# Patient Record
Sex: Male | Born: 1990 | Race: White | Hispanic: No | Marital: Single | Smoking: Former smoker
Health system: Southern US, Community
[De-identification: ages and names within clinical notes are randomized; demographics above are authoritative.]

## PROBLEM LIST (undated history)

## (undated) DIAGNOSIS — I3139 Other pericardial effusion (noninflammatory): Secondary | ICD-10-CM

## (undated) DIAGNOSIS — I313 Pericardial effusion (noninflammatory): Secondary | ICD-10-CM

## (undated) HISTORY — PX: OTHER SURGICAL HISTORY: SHX169

## (undated) HISTORY — PX: PERICARDIAL FLUID DRAINAGE: SHX5100

---

## 2015-07-17 ENCOUNTER — Emergency Department (HOSPITAL_COMMUNITY)
Admission: EM | Admit: 2015-07-17 | Discharge: 2015-07-17 | Disposition: A | Discharge status: Home / Self Care | Payer: Medicaid Other | Attending: Emergency Medicine | Admitting: Emergency Medicine

## 2015-07-17 ENCOUNTER — Encounter (HOSPITAL_COMMUNITY): Payer: Self-pay

## 2015-07-17 DIAGNOSIS — Z79899 Other long term (current) drug therapy: Secondary | ICD-10-CM | POA: Insufficient documentation

## 2015-07-17 DIAGNOSIS — R112 Nausea with vomiting, unspecified: Secondary | ICD-10-CM

## 2015-07-17 DIAGNOSIS — K2970 Gastritis, unspecified, without bleeding: Secondary | ICD-10-CM

## 2015-07-17 DIAGNOSIS — Z880 Allergy status to penicillin: Secondary | ICD-10-CM | POA: Insufficient documentation

## 2015-07-17 DIAGNOSIS — F10120 Alcohol abuse with intoxication, uncomplicated: Secondary | ICD-10-CM | POA: Diagnosis not present

## 2015-07-17 DIAGNOSIS — Z87891 Personal history of nicotine dependence: Secondary | ICD-10-CM | POA: Diagnosis not present

## 2015-07-17 DIAGNOSIS — R079 Chest pain, unspecified: Secondary | ICD-10-CM | POA: Diagnosis present

## 2015-07-17 DIAGNOSIS — F10920 Alcohol use, unspecified with intoxication, uncomplicated: Secondary | ICD-10-CM

## 2015-07-17 DIAGNOSIS — R0789 Other chest pain: Secondary | ICD-10-CM

## 2015-07-17 LAB — COMPREHENSIVE METABOLIC PANEL
ALBUMIN: 4.1 g/dL (ref 3.5–5.0)
ALK PHOS: 64 U/L (ref 38–126)
ALT: 17 U/L (ref 17–63)
ANION GAP: 12 (ref 5–15)
AST: 24 U/L (ref 15–41)
BUN: 14 mg/dL (ref 6–20)
CHLORIDE: 107 mmol/L (ref 101–111)
CO2: 21 mmol/L — ABNORMAL LOW (ref 22–32)
Calcium: 8.4 mg/dL — ABNORMAL LOW (ref 8.9–10.3)
Creatinine, Ser: 1.06 mg/dL (ref 0.61–1.24)
GFR calc non Af Amer: >60 mL/min (ref >60)
GLUCOSE: 126 mg/dL — AB (ref 65–99)
POTASSIUM: 3.3 mmol/L — AB (ref 3.5–5.1)
SODIUM: 140 mmol/L (ref 135–145)
Total Bilirubin: 0.8 mg/dL (ref 0.3–1.2)
Total Protein: 6.6 g/dL (ref 6.5–8.1)

## 2015-07-17 LAB — CBC WITH DIFFERENTIAL/PLATELET
BASOS ABS: 0 10*3/uL (ref 0.0–0.1)
Basophils Relative: 1 % (ref 0–1)
Eosinophils Absolute: 0.2 10*3/uL (ref 0.0–0.7)
Eosinophils Relative: 3 % (ref 0–5)
HCT: 38.3 % — ABNORMAL LOW (ref 39.0–52.0)
HEMOGLOBIN: 13.1 g/dL (ref 13.0–17.0)
LYMPHS ABS: 1.2 10*3/uL (ref 0.7–4.0)
LYMPHS PCT: 17 % (ref 12–46)
MCH: 31 pg (ref 26.0–34.0)
MCHC: 34.2 g/dL (ref 30.0–36.0)
MCV: 90.8 fL (ref 78.0–100.0)
Monocytes Absolute: 0.3 10*3/uL (ref 0.1–1.0)
Monocytes Relative: 5 % (ref 3–12)
NEUTROS ABS: 5.4 10*3/uL (ref 1.7–7.7)
NEUTROS PCT: 75 % (ref 43–77)
Platelets: 224 10*3/uL (ref 150–400)
RBC: 4.22 MIL/uL (ref 4.22–5.81)
RDW: 12.9 % (ref 11.5–15.5)
WBC: 7.3 10*3/uL (ref 4.0–10.5)

## 2015-07-17 LAB — I-STAT TROPONIN, ED: Troponin i, poc: 0 ng/mL (ref 0.00–0.08)

## 2015-07-17 LAB — LIPASE, BLOOD: LIPASE: 34 U/L (ref 22–51)

## 2015-07-17 LAB — ETHANOL: Alcohol, Ethyl (B): 149 mg/dL — ABNORMAL HIGH (ref <5)

## 2015-07-17 MED ORDER — SODIUM CHLORIDE 0.9 % IV SOLN
1000.0000 mL | Freq: Once | INTRAVENOUS | Status: AC
  Administered 2015-07-17: 1000 mL via INTRAVENOUS

## 2015-07-17 MED ORDER — SODIUM CHLORIDE 0.9 % IV SOLN
1000.0000 mL | INTRAVENOUS | Status: DC
  Administered 2015-07-17: 1000 mL via INTRAVENOUS

## 2015-07-17 MED ORDER — ONDANSETRON HCL 4 MG/2ML IJ SOLN
4.0000 mg | Freq: Once | INTRAMUSCULAR | Status: AC
  Administered 2015-07-17: 4 mg via INTRAVENOUS
  Filled 2015-07-17: qty 2

## 2015-07-17 MED ORDER — FAMOTIDINE IN NACL 20-0.9 MG/50ML-% IV SOLN
20.0000 mg | Freq: Once | INTRAVENOUS | Status: AC
  Administered 2015-07-17: 20 mg via INTRAVENOUS
  Filled 2015-07-17: qty 50

## 2015-07-17 MED ORDER — ONDANSETRON HCL 4 MG PO TABS: 4.0000 mg | ORAL_TABLET | Freq: Three times a day (TID) | ORAL | Status: AC | PRN

## 2015-07-17 MED ORDER — OMEPRAZOLE 20 MG PO CPDR: DELAYED_RELEASE_CAPSULE | ORAL | Status: AC

## 2015-07-17 NOTE — ED Notes (Signed)
Dr Karma Ganja in room..... Pt states that he was admitted to the hospital([facility]) in June when he was having chest pain and stated that he was there 1 day. Pt stated that he was told he was diagnosed with anxiety but no heart problems. Pt states that he is on disability "because he can't read or write to good" Pt states that he takes "zoloft" for anxiety.

## 2015-07-17 NOTE — Discharge Instructions (Signed)
You should not be drinking alcohol. Take the medication as prescribed. Keep your appointments at [facility] and your doctor.

## 2015-07-17 NOTE — ED Notes (Signed)
Pt stable, ambulatory, states understanding of discharge instructions 

## 2015-07-17 NOTE — ED Provider Notes (Signed)
CSN: [id number]     Arrival date & time 07/17/15  0213 History   This chart was scribed for Devoria Albe, MD by Arlan Organ, ED Scribe. This patient was seen in room A09C/A09C and the patient's care was started 2:23 AM.   Chief Complaint  Patient presents with  . Chest Pain  . Nausea   The history is provided by the patient. No language interpreter was used.    HPI Comments: Christian Flynn brought in by EMS is a 24 y.o. male with a PMHx of anxiety and panic attacks who presents to the Emergency Department complaining of constant, ongoing central chest pain with associated shortness of breath that started tonight.. Pain is described as sharp. Episodes last approximately 4-5 minutes when they come. Pain is made worse with deep palpation and deep breathing. No alleviating factors at this time. No OTC/prescribed medications attempted prior to arrival. However, 4 mg of Zofran given en route by EMS to department. No recent fever, chills, nausea, or abdominal pain. Christian Flynn was admitted to the hospital in June of this year for chest pain and was diagnosed with anxiety attacks. At time of discharge, pt was advised to stop drinking. However, he admits to consuming 2 glasses of liquor this evening. He is not currently working as he is on disability. Pt with known allergy to Penicillins.  Pt is followed at [facility] and PCP  History reviewed. No pertinent past medical history. History reviewed. No pertinent past surgical history. History reviewed. No pertinent family history. Social History  Substance Use Topics  . Smoking status: Former Smoker    Quit date: 07/16/2000  . Smokeless tobacco: None  . Alcohol Use: Yes     Comment: once a week  on disability for learning disorder Denies smoking Drinks occassionally  Review of Systems  Constitutional: Negative for fever and chills.  Respiratory: Positive for shortness of breath.   Cardiovascular: Positive for chest pain.   Gastrointestinal: Positive for nausea. Negative for vomiting and abdominal pain.  Neurological: Negative for weakness, numbness and headaches.  Psychiatric/Behavioral: Negative for confusion.  All other systems reviewed and are negative.     Allergies  Penicillins  Home Medications   Zoloft for anxiety  Prior to Admission medications   Medication Sig Start Date End Date Taking? Authorizing Provider  ibuprofen (ADVIL,MOTRIN) 800 MG tablet Take 800 mg by mouth every 8 (eight) hours as needed for moderate pain.   Yes Historical Provider, MD  metoprolol tartrate (LOPRESSOR) 25 MG tablet Take 12.5 mg by mouth 2 (two) times daily.   Yes Historical Provider, MD  sertraline (ZOLOFT) 25 MG tablet Take 25 mg by mouth daily.   Yes Historical Provider, MD  omeprazole (PRILOSEC) 20 MG capsule Take 1 po BID x 2 weeks then once a day 07/17/15   Devoria Albe, MD  ondansetron (ZOFRAN) 4 MG tablet Take 1 tablet (4 mg total) by mouth every 8 (eight) hours as needed for nausea or vomiting. 07/17/15   Devoria Albe, MD   Triage Vitals: BP 106/73 mmHg  Pulse 70  Temp(Src) 97.6 F (36.4 C) (Oral)  Resp 18  Ht  (1.778 m)  Wt 130 lb (58.968 kg)  BMI 18.65 kg/m2  SpO2 98%   Vital signs normal    Physical Exam  Constitutional: He is oriented to person, place, and time. He appears well-developed and well-nourished.  Non-toxic appearance. He does not appear ill. No distress.  Appears intoxicated   HENT:  Head: Normocephalic  and atraumatic.  Right Ear: External ear normal.  Left Ear: External ear normal.  Nose: Nose normal. No mucosal edema or rhinorrhea.  Mouth/Throat: Oropharynx is clear and moist and mucous membranes are normal. No dental abscesses or uvula swelling.  Eyes: Conjunctivae and EOM are normal. Pupils are equal, round, and reactive to light.  Neck: Normal range of motion and full passive range of motion without pain. Neck supple.  Cardiovascular: Normal rate, regular rhythm and normal  heart sounds.  Exam reveals no gallop and no friction rub.   No murmur heard. Pulmonary/Chest: Effort normal and breath sounds normal. No respiratory distress. He has no wheezes. He has no rhonchi. He has no rales. He exhibits no tenderness and no crepitus.  Mild anterior L chest wall pain  Abdominal: Soft. Normal appearance and bowel sounds are normal. He exhibits no distension. There is no tenderness. There is no rebound and no guarding.  Musculoskeletal: Normal range of motion. He exhibits no edema or tenderness.  Moves all extremities well.   Neurological: He is alert and oriented to person, place, and time. He has normal strength. No cranial nerve deficit.  Skin: Skin is warm, dry and intact. No rash noted. No erythema. No pallor.  Psychiatric: He has a normal mood and affect. His speech is normal and behavior is normal. His mood appears not anxious.  Nursing note and vitals reviewed.   ED Course  Procedures (including critical care time)  Medications  0.9 %  sodium chloride infusion (0 mLs Intravenous Stopped 07/17/15 0339)    Followed by  0.9 %  sodium chloride infusion (0 mLs Intravenous Stopped 07/17/15 0405)    Followed by  0.9 %  sodium chloride infusion (0 mLs Intravenous Stopped 07/17/15 0405)  ondansetron (ZOFRAN) injection 4 mg (4 mg Intravenous Given 07/17/15 0257)  famotidine (PEPCID) IVPB 20 mg premix (0 mg Intravenous Stopped 07/17/15 0339)     DIAGNOSTIC STUDIES: Oxygen Saturation is 98% on RA, Normal by my interpretation.    COORDINATION OF CARE: 2:23 AM- Will give fluids, Zofran, and Pepcid. Will order CBC, Ethanol, Lipase. Discussed treatment plan with pt at bedside and pt agreed to plan.    Recheck 04:30 pt in bed with his significant other, smiling, states he feels much better.    Labs Review Results for orders placed or performed during the hospital encounter of 07/17/15  Comprehensive metabolic panel  Result Value Ref Range   Sodium 140 135 - 145 mmol/L    Potassium 3.3 (L) 3.5 - 5.1 mmol/L   Chloride 107 101 - 111 mmol/L   CO2 21 (L) 22 - 32 mmol/L   Glucose, Bld 126 (H) 65 - 99 mg/dL   BUN 14 6 - 20 mg/dL   Creatinine, Ser 3.66 0.61 - 1.24 mg/dL   Calcium 8.4 (L) 8.9 - 10.3 mg/dL   Total Protein 6.6 6.5 - 8.1 g/dL   Albumin 4.1 3.5 - 5.0 g/dL   AST 24 15 - 41 U/L   ALT 17 17 - 63 U/L   Alkaline Phosphatase 64 38 - 126 U/L   Total Bilirubin 0.8 0.3 - 1.2 mg/dL   GFR calc non Af Amer >60 >60 mL/min   GFR calc Af Amer >60 >60 mL/min   Anion gap 12 5 - 15  Ethanol  Result Value Ref Range   Alcohol, Ethyl (B) 149 (H) <5 mg/dL  Lipase, blood  Result Value Ref Range   Lipase 34 22 - 51 U/L  CBC with  Differential  Result Value Ref Range   WBC 7.3 4.0 - 10.5 K/uL   RBC 4.22 4.22 - 5.81 MIL/uL   Hemoglobin 13.1 13.0 - 17.0 g/dL   HCT 16.1 (L) 09.6 - 04.5 %   MCV 90.8 78.0 - 100.0 fL   MCH 31.0 26.0 - 34.0 pg   MCHC 34.2 30.0 - 36.0 g/dL   RDW 40.9 81.1 - 91.4 %   Platelets 224 150 - 400 K/uL   Neutrophils Relative % 75 43 - 77 %   Neutro Abs 5.4 1.7 - 7.7 K/uL   Lymphocytes Relative 17 12 - 46 %   Lymphs Abs 1.2 0.7 - 4.0 K/uL   Monocytes Relative 5 3 - 12 %   Monocytes Absolute 0.3 0.1 - 1.0 K/uL   Eosinophils Relative 3 0 - 5 %   Eosinophils Absolute 0.2 0.0 - 0.7 K/uL   Basophils Relative 1 0 - 1 %   Basophils Absolute 0.0 0.0 - 0.1 K/uL  I-Stat Troponin, ED (not at [facility])  Result Value Ref Range   Troponin i, poc 0.00 0.00 - 0.08 ng/mL   Comment 3           Laboratory interpretation all normal except alcohol intoxication, mild hypokalemia     Imaging Review No results found. I have personally reviewed and evaluated these images and lab results as part of my medical decision-making.   EKG Interpretation   Date/Time:  Sunday July 17 2015 02:20:23 EDT Ventricular Rate:  70 PR Interval:  144 QRS Duration: 84 QT Interval:  381 QTC Calculation: 411 R Axis:   82 Text Interpretation:  Sinus rhythm minimal ST  elevation inf leads  Borderline ST elevation, anterior leads Lateral leads are also involved No  old tracing to compare Confirmed by Giannie Soliday  MD-I, Mirriam Vadala ([id number]) on 07/17/2015  2:23:50 AM      MDM   Final diagnoses:  Chest wall pain  Alcohol intoxication, uncomplicated  Nausea and vomiting, vomiting of unspecified type  Gastritis    New Prescriptions   OMEPRAZOLE (PRILOSEC) 20 MG CAPSULE    Take 1 po BID x 2 weeks then once a day   ONDANSETRON (ZOFRAN) 4 MG TABLET    Take 1 tablet (4 mg total) by mouth every 8 (eight) hours as needed for nausea or vomiting.    Plan discharge  Devoria Albe, MD, FACEP   I personally performed the services described in this documentation, which was scribed in my presence. The recorded information has been reviewed and considered.  Devoria Albe, MD, Concha Pyo, MD 07/17/15 [phone]

## 2015-07-17 NOTE — ED Notes (Addendum)
Per EMS, pt complains of upper chest pain x 3 days with nausea, dry heaves, pain increased with inspiration and movement and cough. Pt has gastrix reflux hx. Pt was drinking prior to arrival which increased his burning. Pt has no cardiac hx. Pt was dry heaving with ems. Pt received  of zofran. VSS. 122/86, RR 16, HR 74 NSR. 12 lead unremarkable. NAD.

## 2015-08-21 ENCOUNTER — Emergency Department (HOSPITAL_BASED_OUTPATIENT_CLINIC_OR_DEPARTMENT_OTHER): Payer: Medicaid Other

## 2015-08-21 ENCOUNTER — Emergency Department (HOSPITAL_BASED_OUTPATIENT_CLINIC_OR_DEPARTMENT_OTHER)
Admission: EM | Admit: 2015-08-21 | Discharge: 2015-08-21 | Disposition: A | Discharge status: Home / Self Care | Payer: Medicaid Other | Attending: Emergency Medicine | Admitting: Emergency Medicine

## 2015-08-21 ENCOUNTER — Encounter (HOSPITAL_BASED_OUTPATIENT_CLINIC_OR_DEPARTMENT_OTHER): Payer: Self-pay | Admitting: *Deleted

## 2015-08-21 DIAGNOSIS — Z880 Allergy status to penicillin: Secondary | ICD-10-CM | POA: Insufficient documentation

## 2015-08-21 DIAGNOSIS — Z87891 Personal history of nicotine dependence: Secondary | ICD-10-CM | POA: Insufficient documentation

## 2015-08-21 DIAGNOSIS — R0789 Other chest pain: Secondary | ICD-10-CM | POA: Diagnosis not present

## 2015-08-21 DIAGNOSIS — R110 Nausea: Secondary | ICD-10-CM | POA: Insufficient documentation

## 2015-08-21 DIAGNOSIS — R0602 Shortness of breath: Secondary | ICD-10-CM | POA: Diagnosis not present

## 2015-08-21 DIAGNOSIS — R200 Anesthesia of skin: Secondary | ICD-10-CM | POA: Diagnosis not present

## 2015-08-21 DIAGNOSIS — R079 Chest pain, unspecified: Secondary | ICD-10-CM | POA: Diagnosis present

## 2015-08-21 DIAGNOSIS — Z79899 Other long term (current) drug therapy: Secondary | ICD-10-CM | POA: Diagnosis not present

## 2015-08-21 HISTORY — DX: Pericardial effusion (noninflammatory): I31.3

## 2015-08-21 HISTORY — DX: Other pericardial effusion (noninflammatory): I31.39

## 2015-08-21 LAB — CBC WITH DIFFERENTIAL/PLATELET
BASOS ABS: 0 10*3/uL (ref 0.0–0.1)
Basophils Relative: 0 %
EOS PCT: 2 %
Eosinophils Absolute: 0.1 10*3/uL (ref 0.0–0.7)
HEMATOCRIT: 45.2 % (ref 39.0–52.0)
Hemoglobin: 15.4 g/dL (ref 13.0–17.0)
LYMPHS ABS: 1 10*3/uL (ref 0.7–4.0)
LYMPHS PCT: 21 %
MCH: 30.3 pg (ref 26.0–34.0)
MCHC: 34.1 g/dL (ref 30.0–36.0)
MCV: 88.8 fL (ref 78.0–100.0)
MONO ABS: 0.3 10*3/uL (ref 0.1–1.0)
Monocytes Relative: 6 %
NEUTROS ABS: 3.5 10*3/uL (ref 1.7–7.7)
Neutrophils Relative %: 71 %
PLATELETS: 253 10*3/uL (ref 150–400)
RBC: 5.09 MIL/uL (ref 4.22–5.81)
RDW: 12.6 % (ref 11.5–15.5)
WBC: 5 10*3/uL (ref 4.0–10.5)

## 2015-08-21 LAB — BASIC METABOLIC PANEL
ANION GAP: 11 (ref 5–15)
BUN: 21 mg/dL — AB (ref 6–20)
CO2: 27 mmol/L (ref 22–32)
Calcium: 9.6 mg/dL (ref 8.9–10.3)
Chloride: 101 mmol/L (ref 101–111)
Creatinine, Ser: 1.17 mg/dL (ref 0.61–1.24)
GFR calc Af Amer: >60 mL/min (ref >60)
GFR calc non Af Amer: >60 mL/min (ref >60)
GLUCOSE: 122 mg/dL — AB (ref 65–99)
POTASSIUM: 3.6 mmol/L (ref 3.5–5.1)
Sodium: 139 mmol/L (ref 135–145)

## 2015-08-21 LAB — TROPONIN I
Troponin I: <0.03 ng/mL (ref <0.031)
Troponin I: <0.03 ng/mL (ref <0.031)

## 2015-08-21 MED ORDER — IBUPROFEN 800 MG PO TABS: 800.0000 mg | ORAL_TABLET | Freq: Three times a day (TID) | ORAL | Status: AC

## 2015-08-21 NOTE — ED Notes (Signed)
Pt on cont cardiac monitoring, with cont POX monitoring

## 2015-08-21 NOTE — ED Notes (Signed)
Pericardial effusion last year, treated at [facility], Josephine Igo, MD primary MD for follow up, next appt in Feb 2017

## 2015-08-21 NOTE — ED Notes (Signed)
Awaiting recollect of troponin

## 2015-08-21 NOTE — ED Notes (Signed)
Pt reports left side chest pain since 0500. Reports he felt light-headed, nauseated and sweaty and left arm feels numb. Reports he had "fluid around his heart" last year which they treated at [facility]. States has not had his "heart medicine" since wednesday

## 2015-08-21 NOTE — ED Provider Notes (Signed)
Medical screening examination/treatment/procedure(s) were conducted as a shared visit with non-physician practitioner(s) and myself.  I personally evaluated the patient during the encounter.  Patient with h/o idiopathic pericardial effusion. Here with CP similar to then but no other symptoms. Exam without hypotension, dyspnea, tachycardia, muffled heart sounds or other evidence of significant effusion. To allay patients concerns, bedside US done as below that showed trace pericardial effusion, possibly physiologic, without any cardiac compromise. Delta troponins will be done and patient discharged.     EMERGENCY DEPARTMENT [place] CARDIAC EXAM "Study: Limited Ultrasound of the heart and pericardium"  INDICATIONS:chest pain with h/o effusion Multiple views of the heart and pericardium were obtained in real-time with a multi-frequency probe.  PERFORMED XL:[id number]  IMAGES ARCHIVED?: Yes  FINDINGS: Trace pericardial effusion, Normal contractility, IVC normal and Tamponade physiology absent  LIMITATIONS:  Emergent procedure  VIEWS USED: Subcostal 4 chamber, Parasternal long axis, Parasternal short axis, Apical 4 chamber  and Inferior Vena Cava  INTERPRETATION: Cardiac activity present, Pericardial effusion present, Cardiac tamponade absent, Volume status normal and Normal contractility  CPT Code: 27253-66 (limited transthoracic cardiac)   EKG Interpretation   Date/Time:  Sunday August 21 2015 11:54:47 EDT Ventricular Rate:  100 PR Interval:  124 QRS Duration: 92 QT Interval:  348 QTC Calculation: 448 R Axis:   92 Text Interpretation:  Normal sinus rhythm Right atrial enlargement  Rightward axis Pulmonary disease pattern Abnormal ECG No significant  change since last tracing Confirmed by [facility] MD, Barbara Cower [phone]) on  08/21/2015 12:13:16 PM        Marily Memos, MD 08/22/15 1045

## 2015-08-21 NOTE — ED Notes (Signed)
Presents with left ant chest pain, Sharp, radiates to left arm, onset 0530hrs

## 2015-08-21 NOTE — ED Provider Notes (Signed)
CSN: [id number]     Arrival date & time 08/21/15  1145 History   First MD Initiated Contact with Patient 08/21/15 1203     Chief Complaint  Patient presents with  . Chest Pain     (Consider location/radiation/quality/duration/timing/severity/associated sxs/prior Treatment) HPI Comments: Pt is  A 24 yo male who presents to the ED with complaint of CP, onset 5am this morning. Pt reports having intermittent left sharp CP that lasts appx 5-14min with associated numbness extending to left arm. Endorses nausea and SOB. Denies fever, chills, headache, sore throat, cough, wheezing, abdominal pain, vomiting, urinary sxs, diarrhea, weakness. Pt reports that he had a pericardial effusion requiring drainage a year ago at [facility], etiology unknown. Pt has been followed by cardiologist in [place] since. Pt reports his next apt with Dr. Josephine Igo is not until Feb.    Past Medical History  Diagnosis Date  . Pericardial effusion    Past Surgical History  Procedure Laterality Date  . Pericardial fluid drainage    . Arm surgery     No family history on file. Social History  Substance Use Topics  . Smoking status: Former Smoker    Quit date: 07/16/2000  . Smokeless tobacco: Current User    Types: Snuff  . Alcohol Use: No     Comment: denies    Review of Systems  Respiratory: Positive for shortness of breath.   Cardiovascular: Positive for chest pain.  Gastrointestinal: Positive for nausea.  Neurological: Positive for numbness.  All other systems reviewed and are negative.     Allergies  Penicillins  Home Medications   Prior to Admission medications   Medication Sig Start Date End Date Taking? Authorizing Provider  metoprolol tartrate (LOPRESSOR) 25 MG tablet Take 12.5 mg by mouth 2 (two) times daily.   Yes Historical Provider, MD  sertraline (ZOLOFT) 25 MG tablet Take 25 mg by mouth daily.   Yes Historical Provider, MD  ibuprofen (ADVIL,MOTRIN) 800 MG tablet Take 800 mg by mouth  every 8 (eight) hours as needed for moderate pain.    Historical Provider, MD  omeprazole (PRILOSEC) 20 MG capsule Take 1 po BID x 2 weeks then once a day 07/17/15   Devoria Albe, MD  ondansetron (ZOFRAN) 4 MG tablet Take 1 tablet (4 mg total) by mouth every 8 (eight) hours as needed for nausea or vomiting. 07/17/15   Devoria Albe, MD   BP 120/74 mmHg  Pulse 110  Temp(Src) 98.3 F (36.8 C) (Oral)  Resp 16  Ht  (1.778 m)  Wt 130 lb (58.968 kg)  BMI 18.65 kg/m2  SpO2 100% Physical Exam  Constitutional: He is oriented to person, place, and time. He appears well-developed and well-nourished. No distress.  HENT:  Head: Normocephalic and atraumatic.  Mouth/Throat: Oropharynx is clear and moist. No oropharyngeal exudate.  Eyes: Conjunctivae and EOM are normal. Pupils are equal, round, and reactive to light. Right eye exhibits no discharge. Left eye exhibits no discharge. No scleral icterus.  Neck: Normal range of motion. Neck supple.  Cardiovascular: Normal rate, regular rhythm, normal heart sounds and intact distal pulses.  Exam reveals no gallop and no friction rub.   No murmur heard. Pulmonary/Chest: Effort normal and breath sounds normal. No respiratory distress. He has no wheezes. He has no rales. He exhibits tenderness (left chest wall mildly TTP).  Abdominal: Soft. Bowel sounds are normal. He exhibits no mass. There is no tenderness. There is no rebound and no guarding.  Musculoskeletal: Normal range  of motion. He exhibits no edema or tenderness.  Lymphadenopathy:    He has no cervical adenopathy.  Neurological: He is alert and oriented to person, place, and time.  Skin: Skin is warm and dry. He is not diaphoretic.  Nursing note and vitals reviewed.   ED Course  Procedures (including critical care time) Labs Review Labs Reviewed - No data to display  Imaging Review No results found. I have personally reviewed and evaluated these images and lab results as part of my medical  decision-making.   EKG Interpretation   Date/Time:  Sunday August 21 2015 11:54:47 EDT Ventricular Rate:  100 PR Interval:  124 QRS Duration: 92 QT Interval:  348 QTC Calculation: 448 R Axis:   92 Text Interpretation:  Normal sinus rhythm Right atrial enlargement  Rightward axis Pulmonary disease pattern Abnormal ECG No significant  change since last tracing Confirmed by [facility] MD, Barbara Cower [phone]) on  08/21/2015 12:13:16 PM     Filed Vitals:   08/21/15 1700  BP: 117/69  Pulse: 64  Temp:   Resp: 16      MDM   Final diagnoses:  Chest pain, unspecified chest pain type   Pt present with sharp intermittent left chest pain with associated numbness in his left arm. He reports this CP feels similar to CP he had a year ago with pericardiall effusion. VSS. Cardio exam benign, no rubs or muffled heart sounds. Left chest wall TTP. Lungs CTAB. EKG shows NSR. CXR negative. Troponin negative. Labs unremarkble. Cardiac US done by Dr. Clayborne Dana revealed trace pericardial effusion, possibly physiologic, no cardiac compromise. Delta troponin negative, second EKG unchanged. Plan to d/c pt home with NSAID and advised pt to follow up with his cardiologist this week.  Evaluation does not show pathology requring ongoing emergent intervention or admission. Pt is hemodynamically stable and mentating appropriately. Discussed findings/results and plan with patient/guardian, who agrees with plan. All questions answered. Return precautions discussed and outpatient follow up given.     Satira Sark [place], [place] 08/22/15 1234  Marily Memos, MD 08/22/15 [phone]

## 2015-08-21 NOTE — Discharge Instructions (Signed)
Please take Ibuprofen as prescribed. Please follow up with your Cardiologist this week for follow up to have a ultrasound done to evaluate your heart for pericardial effusion. Please return to the Emergency Department if symptoms worsen or new onset of fever, chills, difficulty breathing.

## 2017-04-04 IMAGING — CR DG CHEST 2V
2 series · 2 of 2 positions shown · non-contrast
Comparison: None.

CLINICAL DATA: Chest pain and shortness of breath for 1 day

EXAM:
CHEST  2 VIEW

[w chest pa]
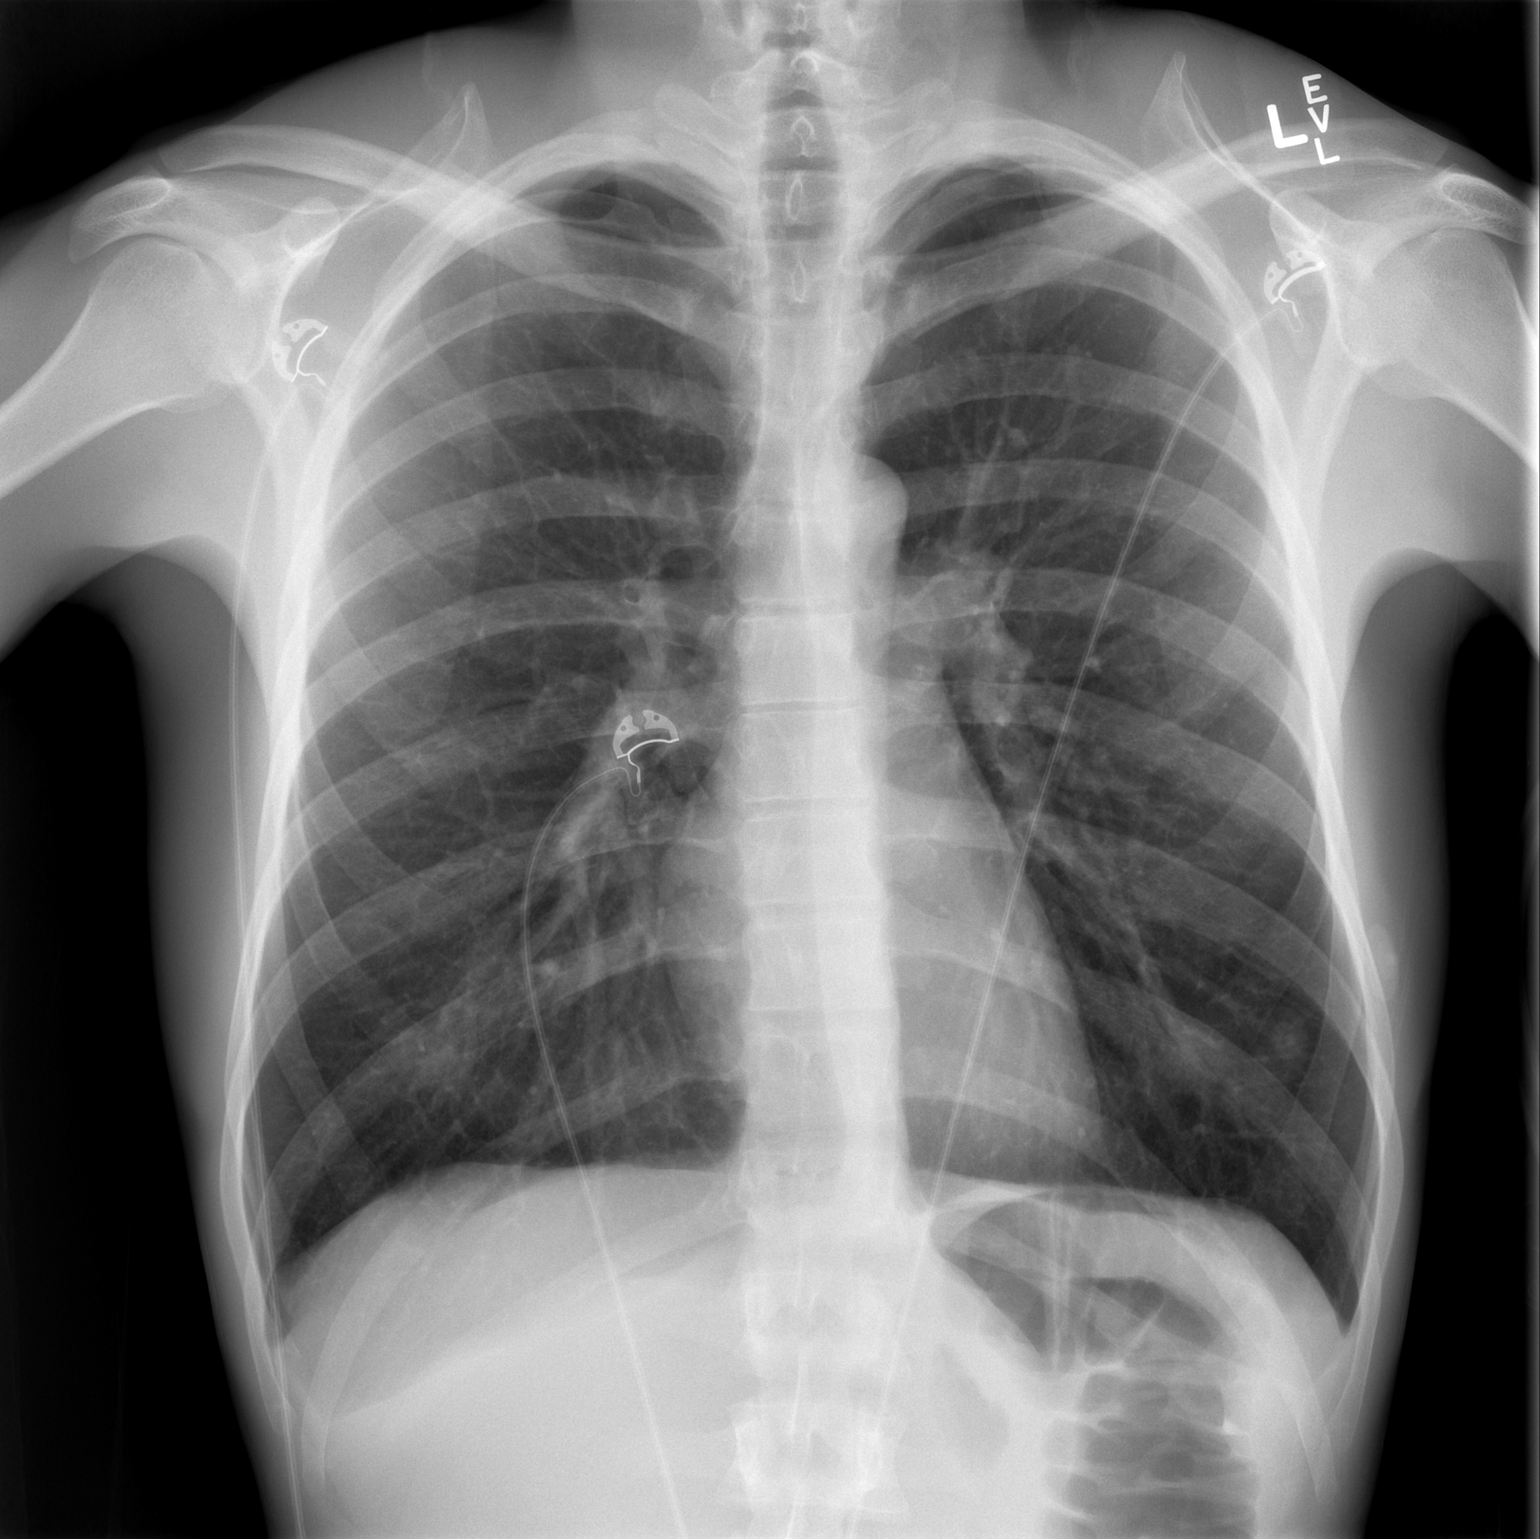

[w chest lat]
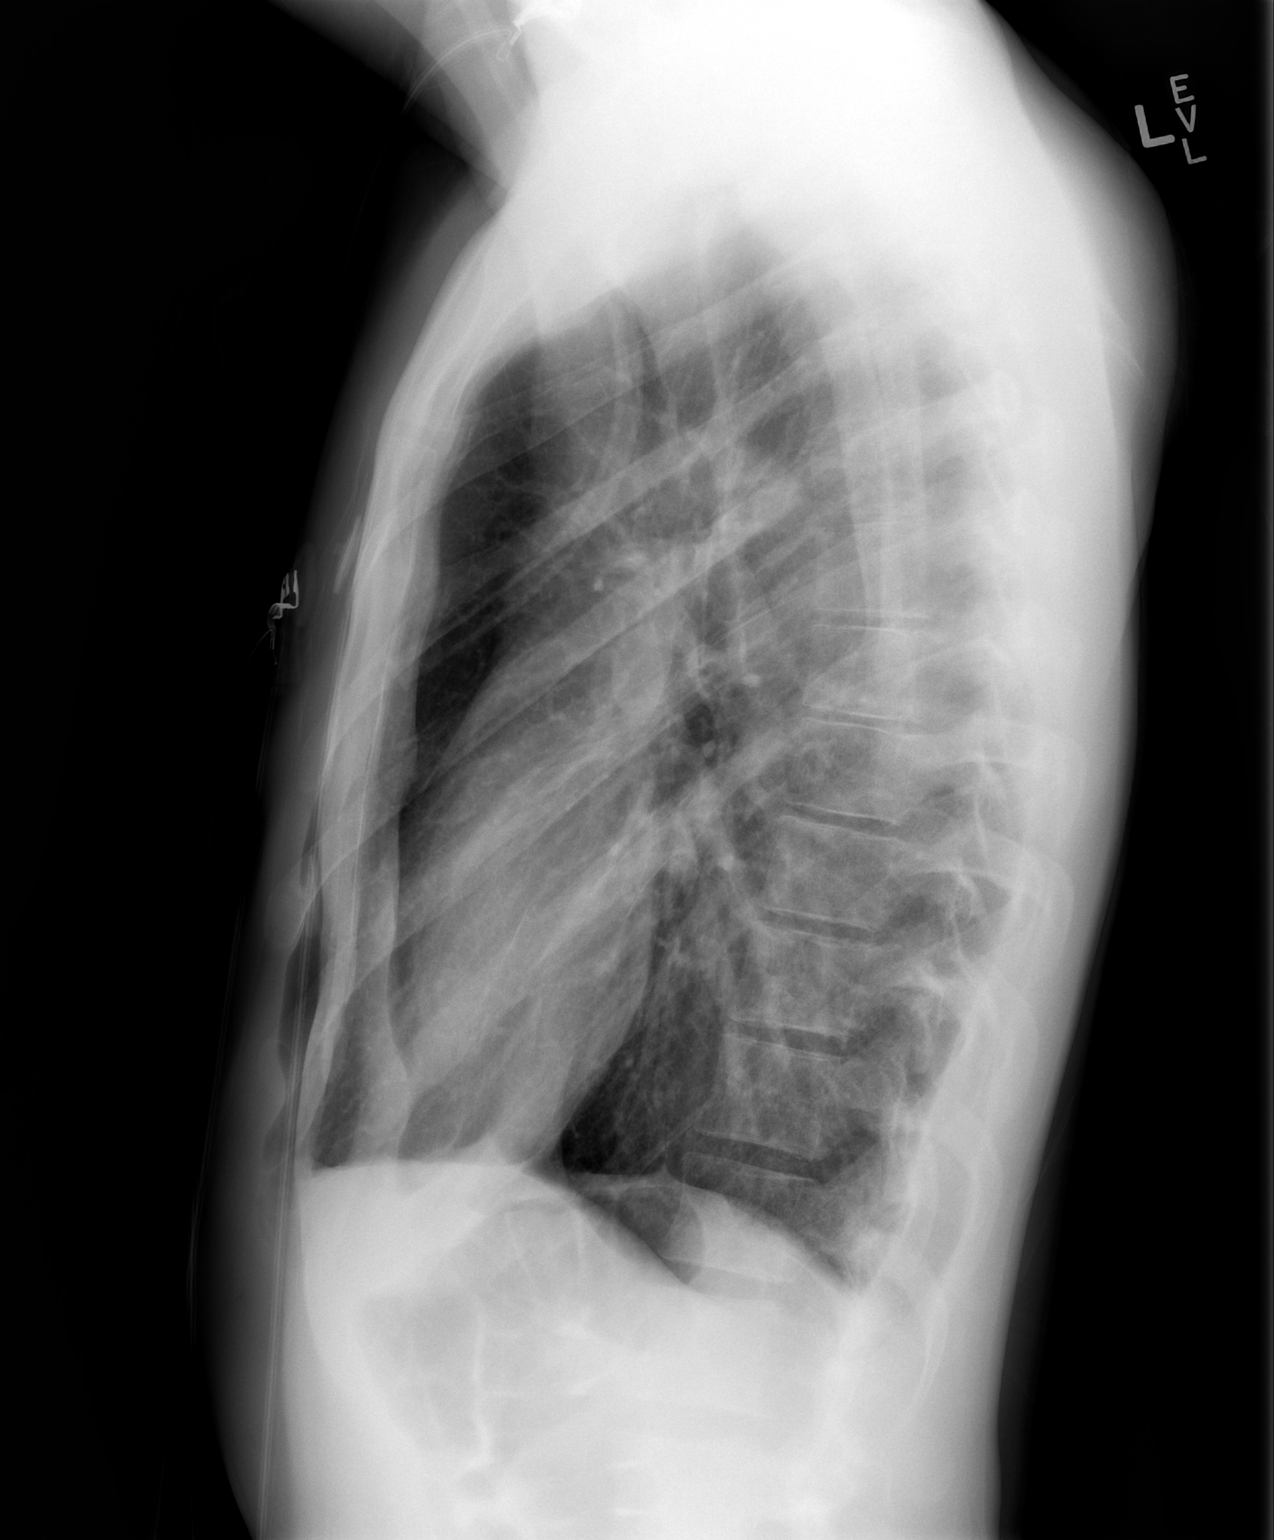

[2 of 2 positions shown; findings below may reference images not displayed]

FINDINGS: There is an apparent nipple shadow on the left. Lungs are clear.
Heart size and pulmonary vascularity are normal. No adenopathy. No
pneumothorax. No bone lesions.
IMPRESSION: No edema or consolidation.  No pneumothorax.

## 2020-03-19 DEATH — deceased
